# Patient Record
Sex: Male | Born: 1970
Health system: Southern US, Community
[De-identification: ages and names within clinical notes are randomized; demographics above are authoritative.]

## PROBLEM LIST (undated history)

## (undated) DIAGNOSIS — I10 Essential (primary) hypertension: Secondary | ICD-10-CM

## (undated) DIAGNOSIS — E785 Hyperlipidemia, unspecified: Secondary | ICD-10-CM

## (undated) DIAGNOSIS — G4733 Obstructive sleep apnea (adult) (pediatric): Secondary | ICD-10-CM

## (undated) DIAGNOSIS — E119 Type 2 diabetes mellitus without complications: Secondary | ICD-10-CM

## (undated) HISTORY — DX: Essential (primary) hypertension: I10

## (undated) HISTORY — PX: ANKLE SURGERY: SHX546

## (undated) HISTORY — PX: NASAL SINUS SURGERY: SHX719

## (undated) HISTORY — PX: SHOULDER SURGERY: SHX246

## (undated) HISTORY — DX: Hyperlipidemia, unspecified: E78.5

## (undated) HISTORY — DX: Obstructive sleep apnea (adult) (pediatric): G47.33

## (undated) HISTORY — DX: Type 2 diabetes mellitus without complications: E11.9

---

## 2016-12-28 ENCOUNTER — Other Ambulatory Visit: Payer: Self-pay | Admitting: Family Medicine

## 2016-12-28 DIAGNOSIS — R7401 Elevation of levels of liver transaminase levels: Secondary | ICD-10-CM

## 2016-12-28 DIAGNOSIS — R740 Nonspecific elevation of levels of transaminase and lactic acid dehydrogenase [LDH]: Principal | ICD-10-CM

## 2017-01-04 ENCOUNTER — Other Ambulatory Visit: Payer: Self-pay

## 2017-01-25 ENCOUNTER — Other Ambulatory Visit: Payer: Self-pay

## 2017-07-13 ENCOUNTER — Ambulatory Visit (INDEPENDENT_AMBULATORY_CARE_PROVIDER_SITE_OTHER): Payer: Commercial Managed Care - PPO | Admitting: Sports Medicine

## 2017-07-13 ENCOUNTER — Ambulatory Visit (INDEPENDENT_AMBULATORY_CARE_PROVIDER_SITE_OTHER): Payer: Commercial Managed Care - PPO

## 2017-07-13 ENCOUNTER — Encounter: Payer: Self-pay | Admitting: Sports Medicine

## 2017-07-13 ENCOUNTER — Other Ambulatory Visit: Payer: Self-pay | Admitting: Sports Medicine

## 2017-07-13 VITALS — BP 134/84 | HR 103 | Resp 16 | Ht 69.0 in | Wt 250.0 lb

## 2017-07-13 DIAGNOSIS — M7751 Other enthesopathy of right foot: Secondary | ICD-10-CM | POA: Diagnosis not present

## 2017-07-13 DIAGNOSIS — M779 Enthesopathy, unspecified: Secondary | ICD-10-CM

## 2017-07-13 DIAGNOSIS — M79671 Pain in right foot: Secondary | ICD-10-CM | POA: Diagnosis not present

## 2017-07-13 DIAGNOSIS — E119 Type 2 diabetes mellitus without complications: Secondary | ICD-10-CM | POA: Diagnosis not present

## 2017-07-13 MED ORDER — MELOXICAM 15 MG PO TABS: 15.0000 mg | ORAL_TABLET | Freq: Every day | ORAL | 0 refills | Status: AC

## 2017-07-13 MED ORDER — TRIAMCINOLONE ACETONIDE 10 MG/ML IJ SUSP: 10.0000 mg | Freq: Once | INTRAMUSCULAR | Status: AC

## 2017-07-13 NOTE — Progress Notes (Signed)
Subjective: Adam Cohen is a 46 y.o. diabetic male patient who presents to office for evaluation of right foot pain. Patient complains of progressive pain especially over the last month and a half in the right foot at the side of the foot. Patient reports that he is not sure how this pain started could have started after an episode of jogging or after he was mowing the grass states that he has tried new shoes and icing, which has not helped. Patient denies any other pedal complaints. Denies known injury/trip/fall/sprain/any causative factors.   Fasting blood sugars less than 120  There are no active problems to display for this patient.   No current outpatient prescriptions on file prior to visit.   No current facility-administered medications on file prior to visit.     No Known Allergies  Objective:  General: Alert and oriented x3 in no acute distress  Dermatology: No open lesions bilateral lower extremities, no webspace macerations, no ecchymosis bilateral, all nails x 10 are well manicured.  Vascular: Dorsalis Pedis and Posterior Tibial pedal pulses palpable, Capillary Fill Time 3 seconds,(+) pedal hair growth bilateral, no edema bilateral lower extremities, Temperature gradient within normal limits.  Neurology: Michaell CowingGross sensation intact via light touch bilateral, Protective sensation intact  with Phoebe PerchSemmes Weinstein Monofilament to all pedal sites, Position sense intact, vibratory intact bilateral, Deep tendon reflexes within normal limits bilateral, No babinski sign present bilateral. (- )Tinels sign bilateral.   Musculoskeletal: Mild tenderness with palpation at Fifth metatarsal base on right foot, there is limitation in joint range of motion at the first metatarsophalangeal joint with dorsal bone spur on right,No pain with calf compression bilateral. There is decreased ankle rom with knee extending  vs flexed resembling gastroc equnius bilateral, Subtalar joint range of motion is within  normal limits, there is no 1st ray hypermobility noted bilateral, decreased 1st MPJ rom Right>Left with functional limitus noted on weightbearing exam. Strength within normal limits in all groups bilateral.   Gait: Antalgic gait  Xrays  Right Foot   Impression: Normal osseous mineralization, there is decreased joint space at first metatarsal phalangeal joint with surrounding spurs on right, there is significant soft tissue swelling at the fifth metatarsal base. No acute fracture or dislocation. No other acute findings.  Assessment and Plan: Problem List Items Addressed This Visit    None    Visit Diagnoses    Tendonitis    -  Primary   Relevant Medications   triamcinolone acetonide (KENALOG) 10 MG/ML injection 10 mg   meloxicam (MOBIC) 15 MG tablet   Foot pain, right       Relevant Medications   triamcinolone acetonide (KENALOG) 10 MG/ML injection 10 mg   meloxicam (MOBIC) 15 MG tablet   Diabetes mellitus without complication (HCC)       Relevant Medications   metFORMIN (GLUCOPHAGE) 1000 MG tablet   simvastatin (ZOCOR) 20 MG tablet   losartan-hydrochlorothiazide (HYZAAR) 100-25 MG tablet   FARXIGA 10 MG TABS tablet   TRULICITY 1.5 MG/0.5ML SOPN   triamcinolone acetonide (KENALOG) 10 MG/ML injection 10 mg   meloxicam (MOBIC) 15 MG tablet       -Complete examination performed -Xrays reviewed -Discussed treatement options -After oral consent and aseptic prep, injected a mixture containing 1 ml of 2%  plain lidocaine, 1 ml 0.5% plain marcaine, 0.5 ml of kenalog 10 and 0.5 ml of dexamethasone phosphate into right fifth metatarsal base without complication. Post-injection care discussed with patient.  -Dispensed heel cushion -RxMeloxicam to take  as instructed -Recommend good supportive shoes daily for foot type and daily inspection in setting of diabetes -Patient to return to office in 1 month for recheck of injection site or sooner if condition worsens.  Adam Cohen, DPM

## 2017-07-13 NOTE — Progress Notes (Signed)
   Subjective:    Patient ID: Adam FergusonGlenn Cohen, male    DOB: [birth year], 46 y.o.   MRN: [id number]  HPI    Review of Systems  All other systems reviewed and are negative.      Objective:   Physical Exam        Assessment & Plan:

## 2017-08-10 ENCOUNTER — Encounter: Payer: Self-pay | Admitting: Sports Medicine

## 2017-08-10 ENCOUNTER — Ambulatory Visit (INDEPENDENT_AMBULATORY_CARE_PROVIDER_SITE_OTHER): Payer: Commercial Managed Care - PPO | Admitting: Sports Medicine

## 2017-08-10 DIAGNOSIS — E119 Type 2 diabetes mellitus without complications: Secondary | ICD-10-CM | POA: Diagnosis not present

## 2017-08-10 DIAGNOSIS — M779 Enthesopathy, unspecified: Secondary | ICD-10-CM | POA: Diagnosis not present

## 2017-08-10 DIAGNOSIS — M79671 Pain in right foot: Secondary | ICD-10-CM | POA: Diagnosis not present

## 2017-08-10 NOTE — Progress Notes (Signed)
Subjective: Adam Cohen is a 46 y.o. diabetic male patient who presents to office for follow up evaluation of right foot pain. Patient states that the pain is better. Now only a little twinge of pain down to baby toe. Otherwise no other symptoms.   There are no active problems to display for this patient.   Current Outpatient Prescriptions on File Prior to Visit  Medication Sig Dispense Refill  . FARXIGA 10 MG TABS tablet     . fenofibrate 160 MG tablet     . losartan-hydrochlorothiazide (HYZAAR) 100-25 MG tablet     . meloxicam (MOBIC) 15 MG tablet Take 1 tablet (15 mg total) by mouth daily. 30 tablet 0  . metFORMIN (GLUCOPHAGE) 1000 MG tablet     . simvastatin (ZOCOR) 20 MG tablet     . TRULICITY 1.5 MG/0.5ML SOPN      Current Facility-Administered Medications on File Prior to Visit  Medication Dose Route Frequency Provider Last Rate Last Dose  . triamcinolone acetonide (KENALOG) 10 MG/ML injection 10 mg  10 mg Other Once Asencion Islam, DPM        No Known Allergies  Objective:  General: Alert and oriented x3 in no acute distress  Dermatology: No open lesions bilateral lower extremities, no webspace macerations, no ecchymosis bilateral, all nails x 10 are well manicured.  Vascular: Dorsalis Pedis and Posterior Tibial pedal pulses palpable, Capillary Fill Time 3 seconds,(+) pedal hair growth bilateral, no edema bilateral lower extremities, Temperature gradient within normal limits.  Neurology: Michaell Cowing sensation intact via light touch bilateral, Protective sensation intact  with Phoebe Perch Monofilament to all pedal sites, Position sense intact, vibratory intact bilateral, Deep tendon reflexes within normal limits bilateral, No babinski sign present bilateral. (- )Tinels sign bilateral.   Musculoskeletal: Resolved tenderness with palpation at Fifth metatarsal base on right foot, there is limitation in joint range of motion at the first metatarsophalangeal joint with dorsal bone  spur on right,No pain with calf compression bilateral. There is decreased ankle rom with knee extending  vs flexed resembling gastroc equnius bilateral, Subtalar joint range of motion is within normal limits, there is no 1st ray hypermobility noted bilateral, decreased 1st MPJ rom Right>Left with functional limitus noted on weightbearing exam. Strength within normal limits in all groups bilateral.   Assessment and Plan: Problem List Items Addressed This Visit    None    Visit Diagnoses    Tendonitis    -  Primary   Foot pain, right       Diabetes mellitus without complication (HCC)           -Complete examination performed -Discussed long term care for tendonitis and limitus  -Recommend patient to see Raiford Noble for orthotics for peroneal brevis tendonitis and hallux limitis -Continue with Meloxicam until completed  -Recommend good supportive shoes daily for foot type and daily inspection in setting of diabetes -Patient to return to office for orthotics or sooner if condition worsens.  Asencion Islam, DPM

## 2017-08-17 ENCOUNTER — Telehealth: Payer: Self-pay | Admitting: Sports Medicine

## 2017-08-17 ENCOUNTER — Ambulatory Visit: Payer: Commercial Managed Care - PPO | Admitting: Orthotics

## 2017-08-17 DIAGNOSIS — M7671 Peroneal tendinitis, right leg: Secondary | ICD-10-CM

## 2017-08-17 DIAGNOSIS — E119 Type 2 diabetes mellitus without complications: Secondary | ICD-10-CM

## 2017-08-17 DIAGNOSIS — M79671 Pain in right foot: Secondary | ICD-10-CM

## 2017-08-17 NOTE — Telephone Encounter (Signed)
Thanks

## 2017-08-17 NOTE — Telephone Encounter (Signed)
Spoke to pt made him aware insurance coverage is 90% after deductible and as long as it is ordered to prevent injury or help recover from injury. Per Dr Marylene LandStover it is to prevent injury and she is documenting additional info into chart. Pt would be responsible for 10% and is aware and said to proceed with the orthotics.

## 2017-08-17 NOTE — Progress Notes (Signed)
Patient discussed with Raiford Nobleick, orthotist. Patient requires biomechanical control to prevent overuse injury to previously inflamed peroneal tendon on right. -Dr. Marylene LandStover

## 2017-08-31 ENCOUNTER — Telehealth: Payer: Self-pay | Admitting: *Deleted

## 2017-08-31 NOTE — Telephone Encounter (Signed)
Received refill request for Meloxicam. Dr. Marylene Land states pt needs an appt prior to future refills. Return fax denying.

## 2017-09-06 ENCOUNTER — Encounter: Payer: Commercial Managed Care - PPO | Admitting: Orthotics

## 2017-09-13 ENCOUNTER — Ambulatory Visit (INDEPENDENT_AMBULATORY_CARE_PROVIDER_SITE_OTHER): Payer: Commercial Managed Care - PPO | Admitting: Orthotics

## 2017-09-13 DIAGNOSIS — E119 Type 2 diabetes mellitus without complications: Secondary | ICD-10-CM

## 2017-09-13 DIAGNOSIS — M779 Enthesopathy, unspecified: Secondary | ICD-10-CM

## 2017-09-13 DIAGNOSIS — M7671 Peroneal tendinitis, right leg: Secondary | ICD-10-CM

## 2017-09-13 NOTE — Progress Notes (Signed)
Patient came in today to pick up custom made foot orthotics.  The goals were accomplished and the patient reported no dissatisfaction with said orthotics.  Patient was advised of breakin period and how to report any issues. 

## 2018-05-03 ENCOUNTER — Ambulatory Visit: Payer: Commercial Managed Care - PPO | Admitting: Sports Medicine

## 2018-05-03 ENCOUNTER — Telehealth: Payer: Self-pay | Admitting: *Deleted

## 2018-05-03 ENCOUNTER — Encounter: Payer: Self-pay | Admitting: Sports Medicine

## 2018-05-03 VITALS — BP 146/89 | HR 83

## 2018-05-03 DIAGNOSIS — M79671 Pain in right foot: Secondary | ICD-10-CM | POA: Diagnosis not present

## 2018-05-03 DIAGNOSIS — E119 Type 2 diabetes mellitus without complications: Secondary | ICD-10-CM

## 2018-05-03 DIAGNOSIS — M7671 Peroneal tendinitis, right leg: Secondary | ICD-10-CM

## 2018-05-03 DIAGNOSIS — M2021 Hallux rigidus, right foot: Secondary | ICD-10-CM | POA: Diagnosis not present

## 2018-05-03 NOTE — Telephone Encounter (Signed)
-----   Message from [place], [place] sent at 05/03/2018  9:02 AM EDT ----- Regarding: MRI R Foot  Pain at lateral side of foot and 1st MTPJ and arch r/o peroneal partial tear eval 1st MTPJ for worsensing arthritis and plantar fascia  -Dr. Kathie Rhodes

## 2018-05-03 NOTE — Telephone Encounter (Signed)
Orders to [facility] for pre-cert, and faxed to [facility]. 

## 2018-05-03 NOTE — Progress Notes (Signed)
Subjective: Adam Cohen is a 47 y.o. diabetic male patient who presents to office for follow up evaluation of right foot pain. Patient states that the pain is back at side of foot and also at big toe joint especially that was aggrevaited with insoles and caused burning pain and arch pain so bad he had to take out liner in shoe in order to walk. Reports from past injection only got 2 weeks of relief. So far pain has continued nothing has offered long term relief. No other issues  FBS per patient is good. Does not recall last A1c.  There are no active problems to display for this patient.   Current Outpatient Medications on File Prior to Visit  Medication Sig Dispense Refill  . cefUROXime (CEFTIN) 250 MG tablet Take 250 mg by mouth 2 (two) times daily.  0  . cyclobenzaprine (FLEXERIL) 5 MG tablet TAKE 1 TO 2 TABLETS BY MOUTH 3 TIMES A DAY AS NEEDED SPASM  1  . FARXIGA 10 MG TABS tablet     . fenofibrate 160 MG tablet     . fluticasone (FLONASE) 50 MCG/ACT nasal spray 1-2 SPRAYS INTO EACH NOSTRIL TWICE A DAY  11  . losartan-hydrochlorothiazide (HYZAAR) 100-25 MG tablet     . meloxicam (MOBIC) 15 MG tablet Take 1 tablet (15 mg total) by mouth daily. 30 tablet 0  . metFORMIN (GLUCOPHAGE) 1000 MG tablet     . simvastatin (ZOCOR) 20 MG tablet     . TRULICITY 1.5 MG/0.5ML SOPN      Current Facility-Administered Medications on File Prior to Visit  Medication Dose Route Frequency Provider Last Rate Last Dose  . triamcinolone acetonide (KENALOG) 10 MG/ML injection 10 mg  10 mg Other Once Asencion Islam, DPM        No Known Allergies  Objective:  General: Alert and oriented x3 in no acute distress  Dermatology: No open lesions bilateral lower extremities, no webspace macerations, no ecchymosis bilateral, all nails x 10 are well manicured.  Vascular: Dorsalis Pedis and Posterior Tibial pedal pulses palpable, Capillary Fill Time 3 seconds,(+) pedal hair growth bilateral, no edema bilateral lower  extremities, Temperature gradient within normal limits.  Neurology: Gross sensation intact via light touch bilateral, Protective sensation intact  with Semmes Weinstein Monofilament to all pedal sites, Position sense intact, vibratory intact bilateral, Deep tendon reflexes within normal limits bilateral, No babinski sign present bilateral. (- )Tinels sign bilateral. Subjective burning in forefoot on right.   Musculoskeletal: Mild tenderness with palpation at Fifth metatarsal base on right foot and arch on right, there is limitation in joint range of motion at the first metatarsophalangeal joint with dorsal bone spur on right,No pain with calf compression bilateral. There is decreased ankle rom with knee extending  vs flexed resembling gastroc equnius bilateral, Subtalar joint range of motion is within normal limits, there is no 1st ray hypermobility noted bilateral, decreased 1st MPJ rom Right>Left with functional limitus noted on weightbearing exam. Strength within normal limits in all groups bilateral.   Assessment and Plan: Problem List Items Addressed This Visit    None    Visit Diagnoses    Peroneal tendonitis of right lower leg    -  Primary   Hallux rigidus of right foot       Diabetes mellitus without complication (HCC)       Arch pain of right foot           -Complete examination performed -Discussed long term care for tendonitis  and limitus with arch pain in setting of diabetes  -Rx MRI eval peroneal tendons r/o partial tear, 1st arthritis and fasciits -Advised patient to continue with supportive care insoles, rest, ice, elevation, soaking topical pain rubs nad OTC anti-inflammatories -Patient to return to office after MRI or sooner if condition worsens.Advised patient will likely need surgery depending on the results of his MRI.   Asencion Islam, DPM

## 2018-05-11 ENCOUNTER — Telehealth: Payer: Self-pay | Admitting: *Deleted

## 2018-05-11 NOTE — Telephone Encounter (Signed)
"  I have an MRI scheduled for tomorrow.  I guess you guys were supposed to contact my insurance or do whatever and find out if I need a pre-authorization or not.  I have not heard back about anything.  I'm curious if that is a necessity or you know what the insurance said.  I am trying to find out some information about it.  Call me back and let me know."

## 2018-05-12 ENCOUNTER — Ambulatory Visit
Admission: RE | Admit: 2018-05-12 | Discharge: 2018-05-12 | Disposition: A | Discharge status: Home / Self Care | Payer: Commercial Managed Care - PPO | Source: Ambulatory Visit | Attending: Sports Medicine | Admitting: Sports Medicine

## 2018-05-12 DIAGNOSIS — M79671 Pain in right foot: Secondary | ICD-10-CM

## 2018-05-12 DIAGNOSIS — M2021 Hallux rigidus, right foot: Secondary | ICD-10-CM

## 2018-05-12 DIAGNOSIS — M7671 Peroneal tendinitis, right leg: Secondary | ICD-10-CM

## 2018-05-12 NOTE — Telephone Encounter (Signed)
I left him a message that his MRI was authorized by a staff person at [facility].  I asked him to call if he had further questions.

## 2018-05-24 ENCOUNTER — Encounter: Payer: Self-pay | Admitting: Sports Medicine

## 2018-05-24 ENCOUNTER — Ambulatory Visit: Payer: Commercial Managed Care - PPO | Admitting: Sports Medicine

## 2018-05-24 DIAGNOSIS — E119 Type 2 diabetes mellitus without complications: Secondary | ICD-10-CM

## 2018-05-24 DIAGNOSIS — M79671 Pain in right foot: Secondary | ICD-10-CM

## 2018-05-24 DIAGNOSIS — M7671 Peroneal tendinitis, right leg: Secondary | ICD-10-CM

## 2018-05-24 DIAGNOSIS — M2021 Hallux rigidus, right foot: Secondary | ICD-10-CM | POA: Diagnosis not present

## 2018-05-24 MED ORDER — TRIAMCINOLONE ACETONIDE 10 MG/ML IJ SUSP: 10.0000 mg | Freq: Once | INTRAMUSCULAR | Status: AC

## 2018-05-24 NOTE — Progress Notes (Signed)
Subjective: Adam Cohen is a 47 y.o. diabetic male patient who presents to office for follow up evaluation of right foot pain. Reports pain to lateral side of foot. Patient states that the pain is still present at Right side of foot and big toe joint. No other issues  FBS per patient is good. Does not recall last A1c.  There are no active problems to display for this patient.   Current Outpatient Medications on File Prior to Visit  Medication Sig Dispense Refill  . cefUROXime (CEFTIN) 250 MG tablet Take 250 mg by mouth 2 (two) times daily.  0  . cyclobenzaprine (FLEXERIL) 5 MG tablet TAKE 1 TO 2 TABLETS BY MOUTH 3 TIMES A DAY AS NEEDED SPASM  1  . FARXIGA 10 MG TABS tablet     . fenofibrate 160 MG tablet     . fluticasone (FLONASE) 50 MCG/ACT nasal spray 1-2 SPRAYS INTO EACH NOSTRIL TWICE A DAY  11  . losartan-hydrochlorothiazide (HYZAAR) 100-25 MG tablet     . meloxicam (MOBIC) 15 MG tablet Take 1 tablet (15 mg total) by mouth daily. 30 tablet 0  . metFORMIN (GLUCOPHAGE) 1000 MG tablet     . simvastatin (ZOCOR) 20 MG tablet     . TRULICITY 1.5 MG/0.5ML SOPN      Current Facility-Administered Medications on File Prior to Visit  Medication Dose Route Frequency Provider Last Rate Last Dose  . triamcinolone acetonide (KENALOG) 10 MG/ML injection 10 mg  10 mg Other Once Asencion IslamStover, Jovanna Hodges, DPM        No Known Allergies  Objective:  General: Alert and oriented x3 in no acute distress  Dermatology: No open lesions bilateral lower extremities, no webspace macerations, no ecchymosis bilateral, all nails x 10 are well manicured.  Vascular: Dorsalis Pedis and Posterior Tibial pedal pulses palpable, Capillary Fill Time 3 seconds,(+) pedal hair growth bilateral, no edema bilateral lower extremities, Temperature gradient within normal limits.  Neurology: Gross sensation intact via light touch bilateral, Protective sensation intact  with Semmes Weinstein Monofilament to all pedal sites, Position  sense intact, vibratory intact bilateral, Deep tendon reflexes within normal limits bilateral, No babinski sign present bilateral. (- )Tinels sign bilateral. Subjective burning in forefoot on right.   Musculoskeletal: Mild tenderness with palpation at Fifth  MTP and metatarsal base on right foot and arch on right, there is limitation in joint range of motion at the first metatarsophalangeal joint with dorsal bone spur on right,No pain with calf compression bilateral. There is decreased ankle rom with knee extending  vs flexed resembling gastroc equnius bilateral, Subtalar joint range of motion is within normal limits, there is no 1st ray hypermobility noted bilateral, decreased 1st MPJ rom Right>Left with functional limitus noted on weightbearing exam. Strength within normal limits in all groups bilateral.   MRI IMPRESSION: RIGHT  1. Severe osteoarthritis of the first MTP joint as described. 2. Small nonspecific subtalar joint effusion. 3. The tendons of the ankle appear normal including the peroneal tendons.  Assessment and Plan: Problem List Items Addressed This Visit    None    Visit Diagnoses    Hallux rigidus of right foot    -  Primary   Peroneal tendonitis of right lower leg       Arch pain of right foot       Diabetes mellitus without complication (HCC)           -Complete examination performed -Discussed long term care for tendonitis and limitus with arch pain  in setting of diabetes  -MRI results reviewed  -After oral consent and aseptic prep, injected a mixture containing 1 ml of 2%  plain lidocaine, 1 ml 0.5% plain marcaine, 0.5 ml of kenalog 10 and 0.5 ml of dexamethasone phosphate into R 5th ray without complication. Post-injection care discussed with patient.  -Added felt padding to right insole  -Follow up for surgery consult in August.   Asencion Islam, DPM

## 2018-06-06 ENCOUNTER — Telehealth: Payer: Self-pay | Admitting: Sports Medicine

## 2018-06-06 NOTE — Telephone Encounter (Signed)
I'm calling to find out how do I get a copy of the MRI I had done on a CD to pick up to take to review with another physician. My phone number is [phone]. Thanks. Bye.

## 2018-06-06 NOTE — Telephone Encounter (Signed)
Called pt to let him know if he wants the actual MRI images on a disc he would need to contact where he went for his MRI because I can only give him the report. Pt stated he understood.

## 2018-08-02 ENCOUNTER — Ambulatory Visit: Payer: Commercial Managed Care - PPO | Admitting: Sports Medicine

## 2018-12-23 DIAGNOSIS — K219 Gastro-esophageal reflux disease without esophagitis: Secondary | ICD-10-CM | POA: Diagnosis not present

## 2018-12-23 DIAGNOSIS — J324 Chronic pansinusitis: Secondary | ICD-10-CM | POA: Diagnosis not present

## 2018-12-23 DIAGNOSIS — R05 Cough: Secondary | ICD-10-CM | POA: Diagnosis not present

## 2019-06-13 NOTE — Progress Notes (Signed)
Referring-Dibas Koirala MD Reason for referral-chest pain  HPI: 48 year old male for evaluation of chest pain at request of Dibas Koirala MD. patient has had occasional chest pain since he was a teenager by report.  It is in the left breast area and is a stabbing sensation lasting 2 minutes at a time.  It is not pleuritic, positional or exertional.  Resolves spontaneously.  No associated symptoms.  No radiation.  He has mild dyspnea with more vigorous activities but not routine activities.  No orthopnea, PND or pedal edema.  No history of syncope.  Cardiology now asked to evaluate.  Current Outpatient Medications  Medication Sig Dispense Refill  . aspirin EC 81 MG tablet Take 81 mg by mouth daily.    Marland Kitchen. FARXIGA 10 MG TABS tablet     . fenofibrate 160 MG tablet     . fluticasone (FLONASE) 50 MCG/ACT nasal spray 1-2 SPRAYS INTO EACH NOSTRIL TWICE A DAY  11  . HYDROcodone-acetaminophen (NORCO/VICODIN) 5-325 MG tablet Take 1-2 tablets by mouth as directed.     Marland Kitchen. losartan-hydrochlorothiazide (HYZAAR) 100-25 MG tablet     . meloxicam (MOBIC) 15 MG tablet Take 1 tablet (15 mg total) by mouth daily. 30 tablet 0  . metFORMIN (GLUCOPHAGE) 1000 MG tablet     . simvastatin (ZOCOR) 20 MG tablet     . TRULICITY 1.5 MG/0.5ML SOPN      Current Facility-Administered Medications  Medication Dose Route Frequency Provider Last Rate Last Dose  . triamcinolone acetonide (KENALOG) 10 MG/ML injection 10 mg  10 mg Other Once Asencion IslamStover, [place], DPM      . triamcinolone acetonide (KENALOG) 10 MG/ML injection 10 mg  10 mg Other Once Asencion IslamStover, Titorya, DPM        No Known Allergies   Past Medical History:  Diagnosis Date  . Diabetes mellitus (HCC)   . Hyperlipidemia   . Hypertension   . OSA (obstructive sleep apnea)     Past Surgical History:  Procedure Laterality Date  . ANKLE SURGERY    . NASAL SINUS SURGERY    . SHOULDER SURGERY      Social History   Socioeconomic History  . Marital status:  Married    Spouse name: Not on file  . Number of children: 3  . Years of education: Not on file  . Highest education level: Not on file  Occupational History    Comment: Human resources officerinancial manager  Social Needs  . Financial resource strain: Not on file  . Food insecurity    Worry: Not on file    Inability: Not on file  . Transportation needs    Medical: Not on file    Non-medical: Not on file  Tobacco Use  . Smoking status: Never Smoker  . Smokeless tobacco: Never Used  Substance and Sexual Activity  . Alcohol use: Yes    Comment: occasional  . Drug use: Never  . Sexual activity: Not on file  Lifestyle  . Physical activity    Days per week: Not on file    Minutes per session: Not on file  . Stress: Not on file  Relationships  . Social Musicianconnections    Talks on phone: Not on file    Gets together: Not on file    Attends religious service: Not on file    Active member of club or organization: Not on file    Attends meetings of clubs or organizations: Not on file    Relationship status: Not on  file  . Intimate partner violence    Fear of current or ex partner: Not on file    Emotionally abused: Not on file    Physically abused: Not on file    Forced sexual activity: Not on file  Other Topics Concern  . Not on file  Social History Narrative  . Not on file    Family History  Problem Relation Age of Onset  . Hypertension Mother   . CAD Mother   . CAD Father     ROS: no fevers or chills, productive cough, hemoptysis, dysphasia, odynophagia, melena, hematochezia, dysuria, hematuria, rash, seizure activity, orthopnea, PND, pedal edema, claudication. Remaining systems are negative.  Physical Exam:   Blood pressure 121/88, height 5\' 9"  (1.753 m), weight 260 lb (117.9 kg).  General:  Well developed/well nourished in NAD Skin warm/dry Patient not depressed No peripheral clubbing Back-normal HEENT-normal/normal eyelids Neck supple/normal carotid upstroke bilaterally; no  bruits; no JVD; no thyromegaly chest - CTA/ normal expansion CV - RRR/normal S1 and S2; no murmurs, rubs or gallops;  PMI nondisplaced Abdomen -NT/ND, no HSM, no mass, + bowel sounds, no bruit 2+ femoral pulses, no bruits Ext-no edema, chords, 2+ DP Neuro-grossly nonfocal  ECG --sinus rhythm with no ST changes.  Personally reviewed  A/P  1 chest pain-symptoms are atypical.  We will arrange a stress nuclear study for risk stratification.  Multiple risk factors including diabetes, hypertension, hyperlipidemia and family history.  2 dyspnea on exertion-we will arrange an echocardiogram to assess LV function.  3 hypertension-patient's blood pressure is controlled.  Continue present medications and follow.  4 hyperlipidemia-continue statin.  Managed by primary care.  5 obesity-we discussed the importance of diet and exercise.  Kirk Ruths, MD

## 2019-06-14 ENCOUNTER — Telehealth (INDEPENDENT_AMBULATORY_CARE_PROVIDER_SITE_OTHER): Payer: Commercial Managed Care - PPO | Admitting: Cardiology

## 2019-06-14 ENCOUNTER — Encounter: Payer: Self-pay | Admitting: Cardiology

## 2019-06-14 ENCOUNTER — Other Ambulatory Visit: Payer: Self-pay

## 2019-06-14 ENCOUNTER — Encounter: Payer: Self-pay | Admitting: *Deleted

## 2019-06-14 VITALS — BP 121/88 | Ht 69.0 in | Wt 260.0 lb

## 2019-06-14 DIAGNOSIS — I10 Essential (primary) hypertension: Secondary | ICD-10-CM

## 2019-06-14 DIAGNOSIS — E7800 Pure hypercholesterolemia, unspecified: Secondary | ICD-10-CM

## 2019-06-14 DIAGNOSIS — R0600 Dyspnea, unspecified: Secondary | ICD-10-CM

## 2019-06-14 DIAGNOSIS — R072 Precordial pain: Secondary | ICD-10-CM

## 2019-06-14 NOTE — Patient Instructions (Signed)
Medication Instructions:  NO CHANGE If you need a refill on your cardiac medications before your next appointment, please call your pharmacy.   Lab work: If you have labs (blood work) drawn today and your tests are completely normal, you will receive your results only by: Marland Kitchen MyChart Message (if you have MyChart) OR . A paper copy in the mail If you have any lab test that is abnormal or we need to change your treatment, we will call you to review the results.  Testing/Procedures: Your physician has requested that you have a lexiscan myoview. For further information please visit [email or web address]. Please follow instruction sheet, as given.  [facility] has requested that you have an echocardiogram. Echocardiography is a painless test that uses sound waves to create images of your heart. It provides your doctor with information about the size and shape of your heart and how well your heart's chambers and valves are working. This procedure takes approximately one hour. There are no restrictions for this procedure.  [place]  Follow-Up: At [facility], you and your health needs are our priority.  As part of our continuing mission to provide you with exceptional heart care, we have created designated Provider Care Teams.  These Care Teams include your primary Cardiologist (physician) and Advanced Practice Providers (APPs -  Physician Assistants and Nurse Practitioners) who all work together to provide you with the care you need, when you need it. Your physician recommends that you schedule a follow-up appointment in:  AS NEEDED PENDING TEST RESULTS

## 2019-06-27 ENCOUNTER — Telehealth (HOSPITAL_COMMUNITY): Payer: Self-pay

## 2019-06-27 NOTE — Telephone Encounter (Signed)
Instructions left on the patient's answering machine, asked to call back with any questions. S.Jettie Lazare EMTP 

## 2019-06-30 ENCOUNTER — Ambulatory Visit (HOSPITAL_BASED_OUTPATIENT_CLINIC_OR_DEPARTMENT_OTHER): Payer: Commercial Managed Care - PPO

## 2019-06-30 ENCOUNTER — Ambulatory Visit (HOSPITAL_COMMUNITY): Payer: Commercial Managed Care - PPO | Attending: Cardiology

## 2019-06-30 ENCOUNTER — Other Ambulatory Visit: Payer: Self-pay

## 2019-06-30 DIAGNOSIS — R072 Precordial pain: Secondary | ICD-10-CM

## 2019-06-30 MED ORDER — TECHNETIUM TC 99M TETROFOSMIN IV KIT
31.4000 | PACK | Freq: Once | INTRAVENOUS | Status: AC | PRN
  Administered 2019-06-30: 31.4 via INTRAVENOUS
  Filled 2019-06-30: qty 32

## 2019-06-30 MED ORDER — TECHNETIUM TC 99M TETROFOSMIN IV KIT
9.7000 | PACK | Freq: Once | INTRAVENOUS | Status: AC | PRN
  Administered 2019-06-30: 9.7 via INTRAVENOUS
  Filled 2019-06-30: qty 10

## 2019-06-30 MED ORDER — REGADENOSON 0.4 MG/5ML IV SOLN
0.4000 mg | Freq: Once | INTRAVENOUS | Status: AC
  Administered 2019-06-30: 0.4 mg via INTRAVENOUS

## 2019-07-03 LAB — MYOCARDIAL PERFUSION IMAGING
LV dias vol: 93 mL (ref 62–150)
LV sys vol: 31 mL
Peak HR: 122 {beats}/min
Rest HR: 77 {beats}/min
SDS: 0
SRS: 0
SSS: 0
TID: 1.12

## 2019-11-16 IMAGING — MR MR FOOT*R* W/O CM
4 of 5 series · 17 of 40 positions shown · non-contrast
Comparison: Radiographs dated 07/13/2017

CLINICAL DATA: Right foot pain. Peroneal tendinitis of the right
ankle.

EXAM:
MRI OF THE RIGHT FOOT WITHOUT CONTRAST
TECHNIQUE: Multiplanar, multisequence MR imaging of the right foot was
performed. No intravenous contrast was administered.

[Series 4: T2 fat-sat · sagittal · right · 3.0mm · 0.62mm/px · 5 of 32 slices shown (1 of 3)]
[im 1/32]
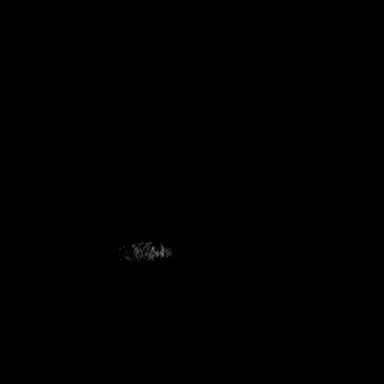
[im 7/32]
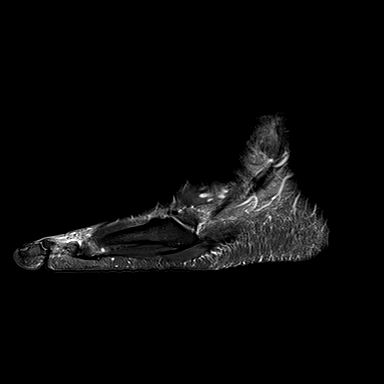
[im 13/32]
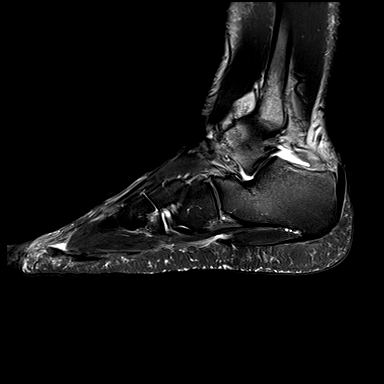
[im 19/32]
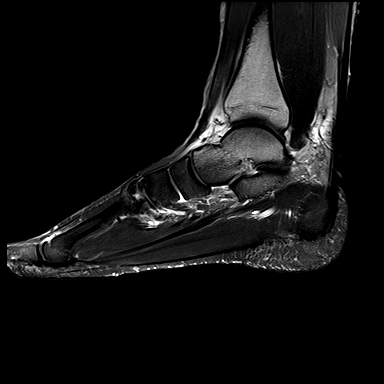
[im 32/32]
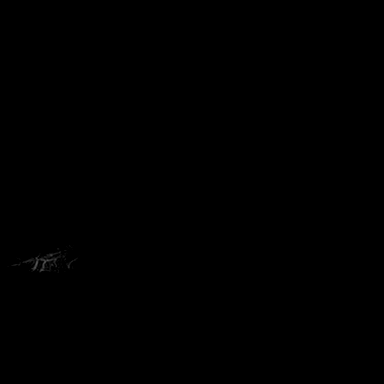

[Series 5: T2 fat-sat · coronal · right · 4.0mm · 0.36mm/px · 3 of 44 slices shown (2 of 3)]
[im 5/44]
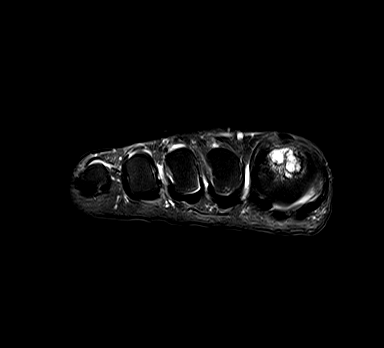
[im 24/44]
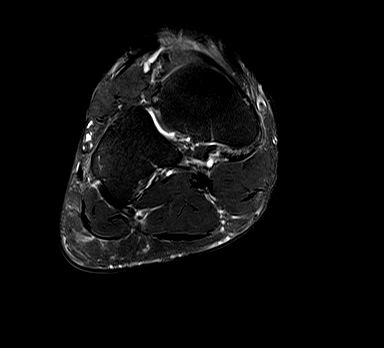
[im 39/44]
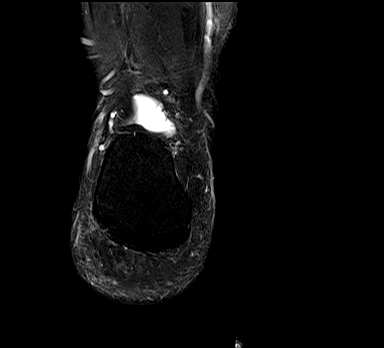

[Series 7: T2 fat-sat · axial · right · 3.0mm · 0.54mm/px · z∈[-90,-14]mm · 3 of 36 slices shown (3 of 3)]
[im 6/36]
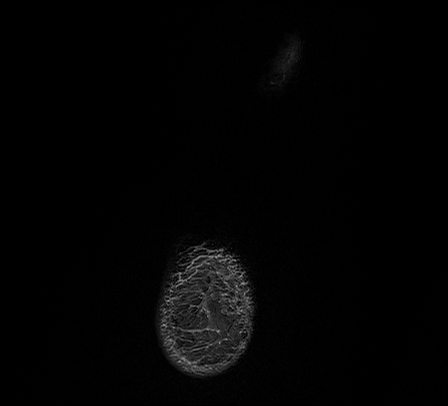
[im 21/36]
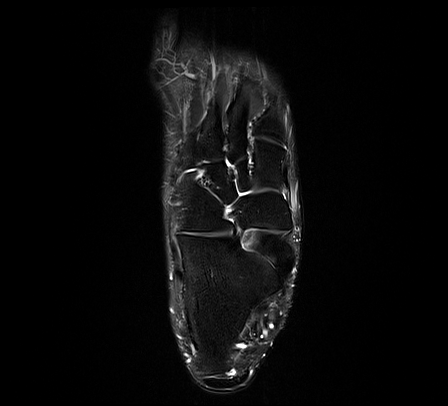
[im 31/36]
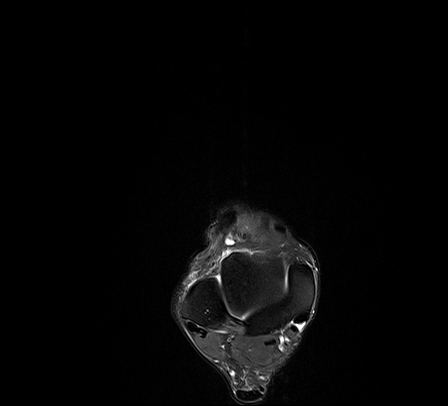

[Series 8: PD fat-sat · axial · right · 4.0mm · 0.20mm/px · z∈[-43,+77]mm · 6 of 27 slices shown]
[im 1/27]
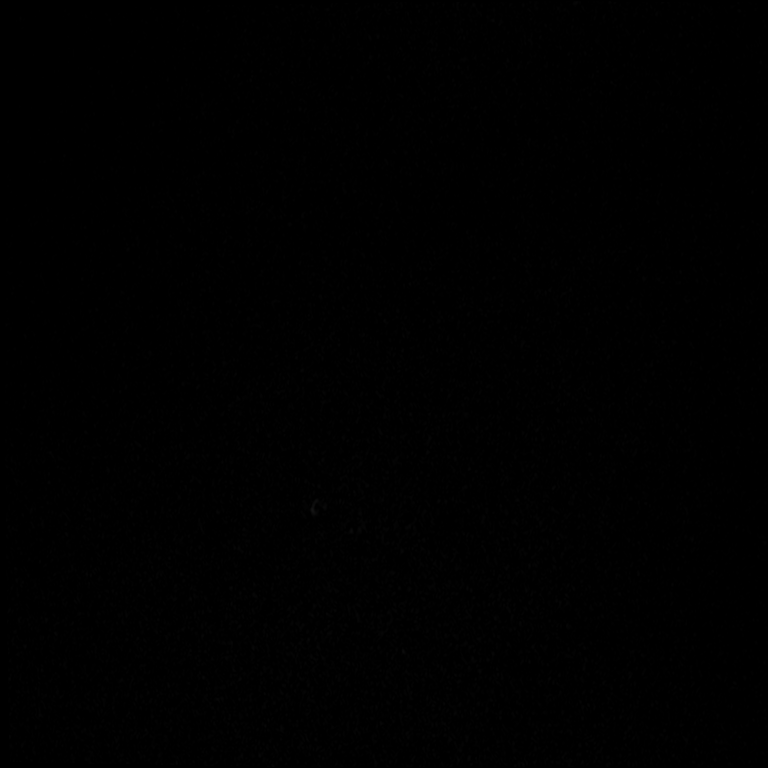
[im 6/27]
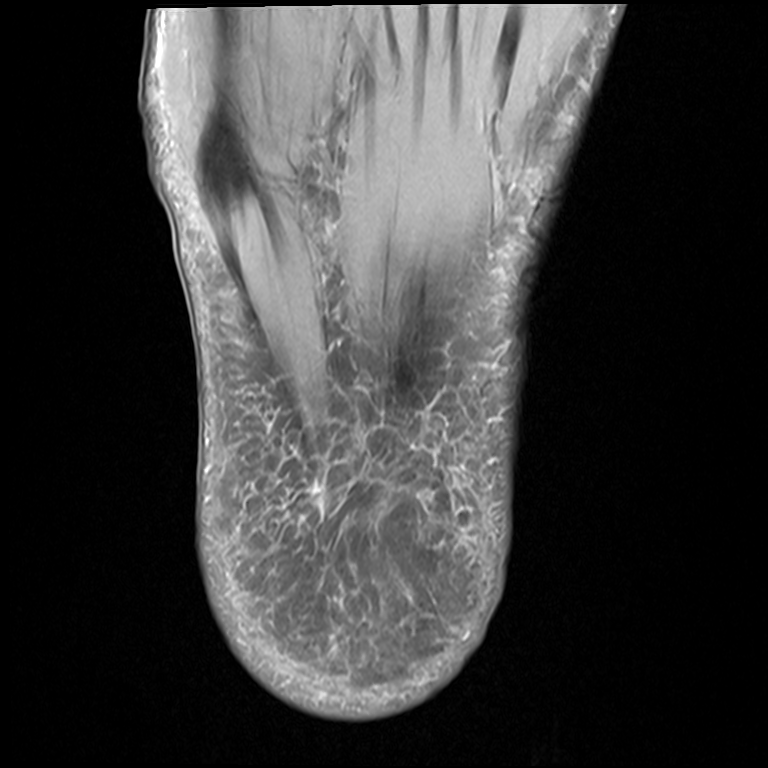
[im 11/27]
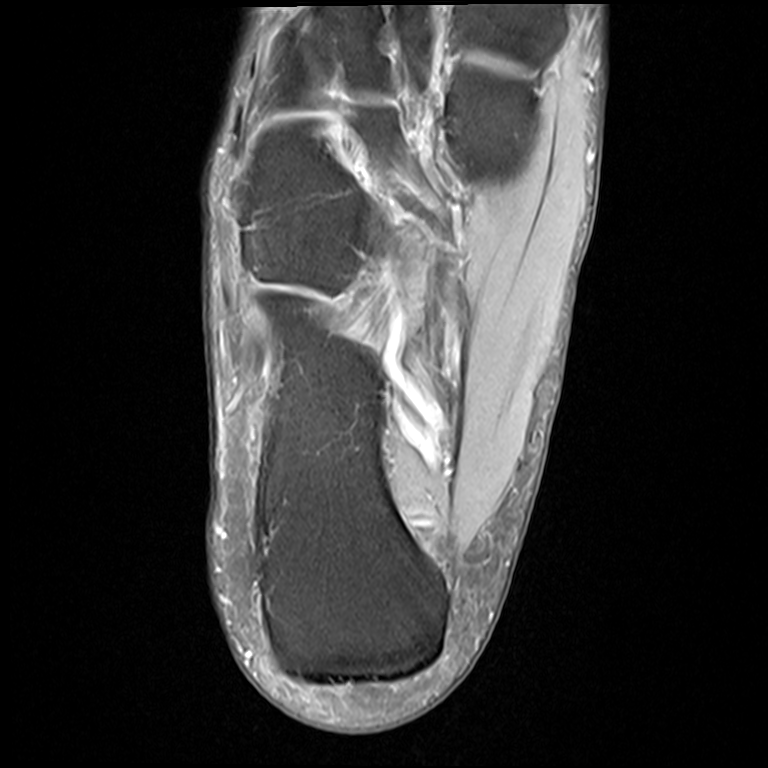
[im 16/27]
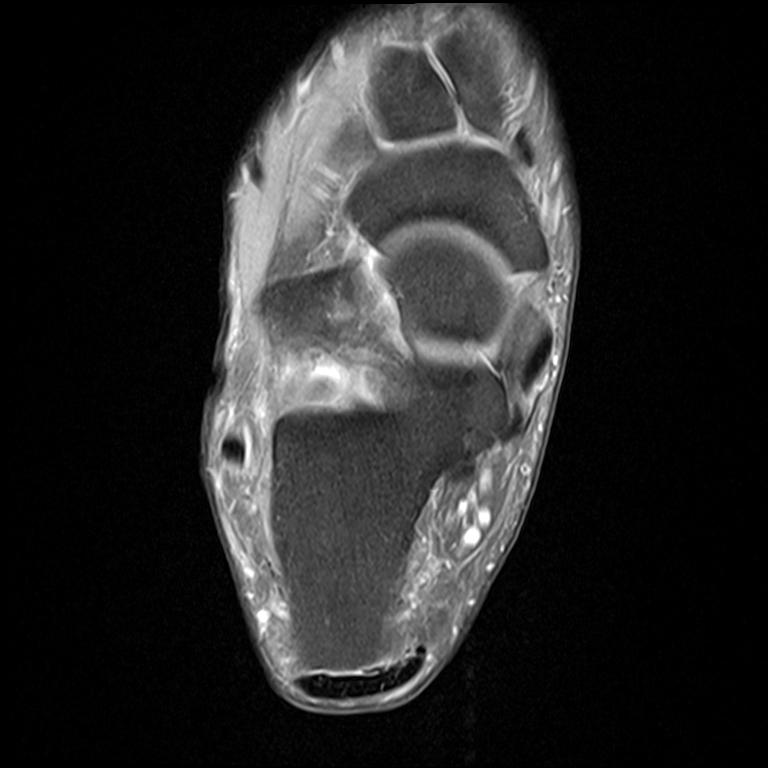
[im 21/27]
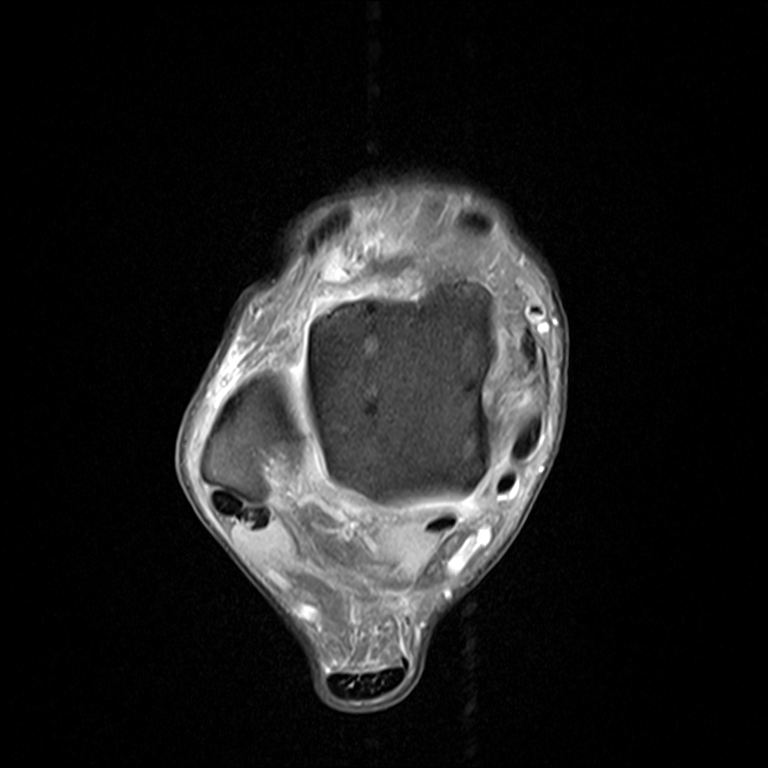
[im 27/27]
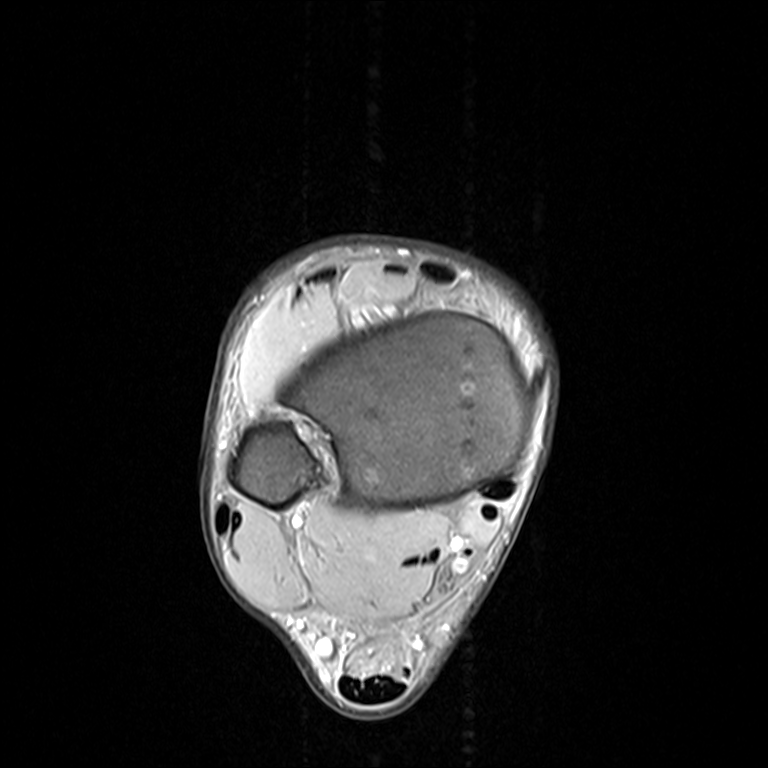

[17 of 40 positions shown; findings below may reference images not displayed]

FINDINGS: TENDONS

Peroneal: Normal. Specifically, no evidence of tear or degeneration
or tenosynovitis of the peroneal tendons. Specifically, the peroneus
brevis tendon appears normal including at its insertion on the base
of the fifth metatarsal.

Posteromedial: Normal.

Anterior: Normal.

Achilles: Normal.

Plantar Fascia: Normal.

LIGAMENTS

Lateral: Normal.

Medial: Normal.

CARTILAGE

Ankle Joint: Normal.

Subtalar Joints/Sinus Tarsi: Small nonspecific effusion in the
posterior aspect of the subtalar joint. No chondral defects. Spring
ligament is intact.

Bones: Severe arthritic changes of first MTP joint. Complex
prominent 14 x 12 x 12 mm subcortical cyst in the head of the fifth
metatarsal. Marginal osteophytes. Focal edema in the dorsal aspect
of the base of the proximal phalanx of the great toe. Sesamoids
appear normal.

Other: None
IMPRESSION: 1. Severe osteoarthritis of the first MTP joint as described.
2. Small nonspecific subtalar joint effusion.
3. The tendons of the ankle appear normal including the peroneal
tendons.

## 2023-01-08 ENCOUNTER — Other Ambulatory Visit (HOSPITAL_BASED_OUTPATIENT_CLINIC_OR_DEPARTMENT_OTHER): Payer: Self-pay | Admitting: Family Medicine

## 2023-01-08 DIAGNOSIS — E7800 Pure hypercholesterolemia, unspecified: Secondary | ICD-10-CM

## 2023-01-29 ENCOUNTER — Ambulatory Visit (HOSPITAL_BASED_OUTPATIENT_CLINIC_OR_DEPARTMENT_OTHER)
Admission: RE | Admit: 2023-01-29 | Discharge: 2023-01-29 | Disposition: A | Discharge status: Home / Self Care | Payer: Commercial Managed Care - PPO | Source: Ambulatory Visit | Attending: Family Medicine | Admitting: Family Medicine

## 2023-01-29 DIAGNOSIS — E7800 Pure hypercholesterolemia, unspecified: Secondary | ICD-10-CM | POA: Insufficient documentation

## 2023-02-24 NOTE — Progress Notes (Signed)
Cardiology Office Note:   Date:  03/08/2023  ID:  Adam Cohen, DOB 1971-11-30, MRN [id number]  History of Present Illness:   Adam Cohen is a 52 y.o. male with history of DMII, HTN, HLD and coronary artery calcification on CT chest who was referred by Dr. Dorthy Cooler for further evaluation of coronary artery disease.  Patient seen by Dr. Stanford Breed in 2020 for chest pain. Myoview at that time normal with no ischemia. TTE 06/2019 with LVEF >65%, normal RV, no significant valve disease.  Underwent Ca score in 01/2023 which was 250 (95%) prompting referral to Cardiology for further evaluation.  Today, the patient overall feels well. He has chronic chest pain that has been present since high school but this has not changed or progressed. Has been noting increased dyspnea on exertion with activity when compared to several months ago. Finding he is needing to take more breaks than he used to in order to catch his breath. No exertional chest pain, orthopnea, PND, LE edema. Does not monitor blood pressure at home, but running 140s in clinic.  Mother with CAD s/p CABG; Father with CAD s/p CABG (50s)  ROS: As per HPI  Studies Reviewed:    EKG:  NSR, TWI inferior leads, HR 81-personally reviewed  Cardiac Studies & Procedures     STRESS TESTS  MYOCARDIAL PERFUSION IMAGING 07/03/2019  Narrative  Nuclear stress EF: 67%. No wall motion abnormalities.  The left ventricular ejection fraction is hyperdynamic (>65%).  There was no ST segment deviation noted during stress.  This is a low risk study. no ischemia.  Candee Furbish, MD   ECHOCARDIOGRAM  ECHOCARDIOGRAM COMPLETE 06/30/2019  Narrative ECHOCARDIOGRAM REPORT    Patient Name:   Adam Cohen    Date of Exam: 06/30/2019 Medical Rec #:  [id number]     Height:       69.0 in Accession #:    [id number]    Weight:       260.0 lb Date of Birth:  Apr 19, 1971     BSA:          2.31 m Patient Age:    52 years      BP:           122/88 mmHg Patient Gender:  M             HR:           83 bpm. Exam Location:  [place]   Procedure: 2D Echo, Cardiac Doppler, Color Doppler and Strain Analysis  Indications:    R07.9 Chest pain  History:        Patient has no prior history of Echocardiogram examinations. Signs/Symptoms: DOE and Chest Pain Risk Factors: Obesity, Sleep Apnea, Family History of Coronary Artery Disease, Diabetes, Hypertension and Dyslipidemia.  Sonographer:    Lenard Galloway BA, RDCS Referring Phys: [place]   1. The left ventricle has hyperdynamic systolic function, with an ejection fraction of >65%. The cavity size was normal. Left ventricular diastolic Doppler parameters are consistent with impaired relaxation. Indeterminate filling pressures The E/e' is 8-15. 2. The right ventricle has normal systolic function. The cavity was normal. There is no increase in right ventricular wall thickness. 3. The mitral valve is grossly normal. 4. The tricuspid valve is grossly normal. 5. The aortic valve is tricuspid. No stenosis of the aortic valve. 6. The aortic root and ascending aorta are normal in size and structure. 7. The average left ventricular global longitudinal strain is -21.0 %.  SUMMARY  LVEF >65%, normal wall thickness, normal wall motion, grade 1 DD, indeterminate LV filling pressure, normal GLS at -21%, normal biatrial size, no significant valvular disease, normal aorta, no pericardial effusion FINDINGS Left Ventricle: The left ventricle has hyperdynamic systolic function, with an ejection fraction of >65%. The cavity size was normal. There is no increase in left ventricular wall thickness. Left ventricular diastolic Doppler parameters are consistent with impaired relaxation. Indeterminate filling pressures The E/e' is 8-15. The average left ventricular global longitudinal strain is -21.0 %.  Right Ventricle: The right ventricle has normal systolic function. The cavity was normal. There  is no increase in right ventricular wall thickness.  Left Atrium: Left atrial size was normal in size.  Right Atrium: Right atrial size was normal in size. Right atrial pressure is estimated at 10 mmHg.  Interatrial Septum: No atrial level shunt detected by color flow Doppler.  Pericardium: There is no evidence of pericardial effusion.  Mitral Valve: The mitral valve is grossly normal. Mitral valve regurgitation is not visualized by color flow Doppler.  Tricuspid Valve: The tricuspid valve is grossly normal. Tricuspid valve regurgitation was not visualized by color flow Doppler.  Aortic Valve: The aortic valve is tricuspid Aortic valve regurgitation was not visualized by color flow Doppler. There is No stenosis of the aortic valve.  Pulmonic Valve: The pulmonic valve was grossly normal. Pulmonic valve regurgitation is not visualized by color flow Doppler.  Aorta: The aortic root and ascending aorta are normal in size and structure.   +--------------+--------++ LEFT VENTRICLE         +----------------+---------++ +--------------+--------++ Diastology                PLAX 2D                +----------------+---------++ +--------------+--------++ LV e' lateral:  9.57 cm/s LVIDd:        4.50 cm  +----------------+---------++ +--------------+--------++ LV E/e' lateral:8.7       LVIDs:        2.39 cm  +----------------+---------++ +--------------+--------++ LV e' medial:   6.64 cm/s LV PW:        1.12 cm  +----------------+---------++ +--------------+--------++ LV E/e' medial: 12.5      LV IVS:       0.85 cm  +----------------+---------++ +--------------+--------++ LVOT diam:    2.40 cm  +----------------------+-------++ +--------------+--------++ 2D Longitudinal Strain        LV SV:        72 ml    +----------------------+-------++ +--------------+--------++ 2D Strain GLS Avg:    -21.0 % LV SV Index:  29.63     +----------------------+-------++ +--------------+--------++ LVOT Area:    4.52 cm +--------------+--------++                        +--------------+--------++  +---------------+----------++ RIGHT VENTRICLE           +---------------+----------++ RV Basal diam: 3.21 cm    +---------------+----------++ RV S prime:    13.20 cm/s +---------------+----------++ TAPSE (M-mode):2.2 cm     +---------------+----------++  +---------------+-------++-----------++ LEFT ATRIUM           Index       +---------------+-------++-----------++ LA diam:       4.00 cm1.73 cm/m  +---------------+-------++-----------++ LA Vol (A2C):  44.9 ml19.44 ml/m +---------------+-------++-----------++ LA Vol (A4C):  47.3 ml20.48 ml/m +---------------+-------++-----------++ LA Biplane Vol:46.7 ml20.22 ml/m +---------------+-------++-----------++ +------------+-----------++ AORTIC VALVE            +------------+-----------++ LVOT Vmax:  106.00 cm/s +------------+-----------++  LVOT Vmean: 77.900 cm/s +------------+-----------++ LVOT VTI:   0.235 m     +------------+-----------++  +-------------+-------++ AORTA                +-------------+-------++ Ao Root diam:3.10 cm +-------------+-------++ Ao Asc diam: 3.40 cm +-------------+-------++  +--------------+--------++ MITRAL VALVE             +--------------+-------+ +--------------+--------++   SHUNTS                MV Area (PHT):           +--------------+-------+ +--------------+--------++   Systemic VTI: 0.24 m  MV PHT:                  +--------------+-------+ +--------------+--------++   Systemic Diam:2.40 cm MV Decel Time:292 msec   +--------------+-------+ +--------------+--------++ +--------------+----------++ MV E velocity:82.90 cm/s +--------------+----------++ MV A velocity:72.80  cm/s +--------------+----------++ MV E/A ratio: 1.14       +--------------+----------++   Lyman Bishop MD Electronically signed by Lyman Bishop MD Signature Date/Time: 06/30/2019/2:20:10 PM    Final     CT SCANS  CT CARDIAC SCORING (SELF PAY ONLY) 01/30/2023  Addendum 01/30/2023  3:34 PM ADDENDUM REPORT: 01/30/2023 15:32  EXAM: OVER-READ INTERPRETATION  CT CHEST  The following report is an over-read performed by radiologist Dr. Rebekah Chesterfield [facility] on 01/30/2023. This over-read does not include interpretation of cardiac or coronary anatomy or pathology. The coronary calcium score interpretation by the cardiologist is attached.  COMPARISON:  None.  FINDINGS: Atherosclerotic calcifications in the thoracic aorta. Within the visualized portions of the thorax there are no suspicious appearing pulmonary nodules or masses, there is no acute consolidative airspace disease, no pleural effusions, no pneumothorax and no lymphadenopathy. Visualized portions of the upper abdomen demonstrates severe diffuse low attenuation throughout the visualized hepatic parenchyma, indicative of a background of severe hepatic steatosis. There are no aggressive appearing lytic or blastic lesions noted in the visualized portions of the skeleton.  IMPRESSION: 1.  Aortic Atherosclerosis (ICD10-I70.0). 2. Severe hepatic steatosis.   Electronically Signed By: Vinnie Langton M.D. On: 01/30/2023 15:32  Narrative CLINICAL DATA:  31M for cardiovascular disease risk stratification  EXAM: Coronary Calcium Score  TECHNIQUE: A gated, non-contrast computed tomography scan of the heart was performed using 5mm slice thickness. Axial images were analyzed on a dedicated workstation. Calcium scoring of the coronary arteries was performed using the Agatston method.  FINDINGS: Coronary arteries: Normal origins.  Coronary Calcium Score:  Left main: 0  Left anterior  descending artery: 57.7  Left circumflex artery: 192  Right coronary artery: 0  Total: 250  Percentile: 95th  Pericardium: Normal.  Ascending Aorta: Normal caliber.  3.5 cm.  Aortic atherosclerosis.  Non-cardiac: See separate report from [facility].  IMPRESSION: Coronary calcium score of 250. This was 95th percentile for age-, race-, and sex-matched controls.  Aortic atherosclerosis.  RECOMMENDATIONS: Coronary artery calcium (CAC) score is a strong predictor of incident coronary heart disease (CHD) and provides predictive information beyond traditional risk factors. CAC scoring is reasonable to use in the decision to withhold, postpone, or initiate statin therapy in intermediate-risk or selected borderline-risk asymptomatic adults (age 57-75 years and LDL-C >=70 to <190 mg/dL) who do not have diabetes or established atherosclerotic cardiovascular disease (ASCVD).* In intermediate-risk (10-year ASCVD risk >=7.5% to <20%) adults or selected borderline-risk (10-year ASCVD risk >=5% to <7.5%) adults in whom a CAC score is measured for the purpose of making a treatment decision the following recommendations have been made:  If CAC=0, it is reasonable to withhold statin therapy and reassess in 5 to 10 years, as long as higher risk conditions are absent (diabetes mellitus, family history of premature CHD in first degree relatives (males <55 years; females <65 years), cigarette smoking, or LDL >=190 mg/dL).  If CAC is 1 to 99, it is reasonable to initiate statin therapy for patients >=66 years of age.  If CAC is >=100 or >=75th percentile, it is reasonable to initiate statin therapy at any age.  Cardiology referral should be considered for patients with CAC scores >=400 or >=75th percentile.  *2018 AHA/ACC/AACVPR/AAPA/ABC/ACPM/ADA/AGS/APhA/ASPC/NLA/PCNA Guideline on the Management of Blood Cholesterol: A Report of the American College of Cardiology/American  Heart Association Task Force on Clinical Practice Guidelines. J Am Coll Cardiol. 2019;73(24):3168-3209.  Skeet Latch, MD  Electronically Signed: By: Skeet Latch M.D. On: 01/30/2023 15:10          Risk Assessment/Calculations:     HYPERTENSION CONTROL Vitals:   03/08/23 0819 03/08/23 0849  BP: (!) 148/90 (!) 140/90    The patient's blood pressure is elevated above target today.  In order to address the patient's elevated BP: A current anti-hypertensive medication was adjusted today.           Physical Exam:   VS:  BP (!) 140/90   Pulse 81   Ht 5\' 9"  (1.753 m)   Wt 252 lb 6.4 oz (114.5 kg)   SpO2 97%   BMI 37.27 kg/m    Wt Readings from Last 3 Encounters:  03/08/23 252 lb 6.4 oz (114.5 kg)  06/30/19 260 lb (117.9 kg)  06/14/19 260 lb (117.9 kg)     GEN: Well nourished, well developed in no acute distress NECK: No JVD; No carotid bruits CARDIAC: RRR, no murmurs, rubs, gallops RESPIRATORY:  Clear to auscultation without rales, wheezing or rhonchi  ABDOMEN: Soft, non-tender, non-distended EXTREMITIES:  No edema; No deformity   ASSESSMENT AND PLAN:    #Coronary Artery Ca: Ca score elevated at 250 (95%). Currently with worsening DOE when compared to several months ago. Will check coronary CTA for further evaluation and continue with aggressive secondary prevention. -Check coronary CTA -Continue crestor 10mg  daily -Continue ASA 81mg  daily  #HTN: -Elevated to 140s in clinic -Continue losartan-HCTZ 100-25mg  daily -Increase amlodipine to 5mg  daily -Goal BP <130/90  #DMII: -Continue trulicity -Continue metformin -Continue farxiga  -Management per PCP  #HLD: #Fatty Liver Disease: -Continue crestor 10mg  daily -Check lipids in 3 weeks -Goal LDL<70  #OSA: -On CPAP        Signed, Freada Bergeron, MD

## 2023-03-08 ENCOUNTER — Encounter: Payer: Self-pay | Admitting: Cardiology

## 2023-03-08 ENCOUNTER — Ambulatory Visit: Payer: Commercial Managed Care - PPO | Attending: Cardiology | Admitting: Cardiology

## 2023-03-08 VITALS — BP 140/90 | HR 81 | Ht 69.0 in | Wt 252.4 lb

## 2023-03-08 DIAGNOSIS — G4733 Obstructive sleep apnea (adult) (pediatric): Secondary | ICD-10-CM

## 2023-03-08 DIAGNOSIS — E785 Hyperlipidemia, unspecified: Secondary | ICD-10-CM | POA: Diagnosis not present

## 2023-03-08 DIAGNOSIS — R072 Precordial pain: Secondary | ICD-10-CM | POA: Diagnosis not present

## 2023-03-08 DIAGNOSIS — Z79899 Other long term (current) drug therapy: Secondary | ICD-10-CM

## 2023-03-08 DIAGNOSIS — I10 Essential (primary) hypertension: Secondary | ICD-10-CM

## 2023-03-08 DIAGNOSIS — E119 Type 2 diabetes mellitus without complications: Secondary | ICD-10-CM

## 2023-03-08 DIAGNOSIS — I25119 Atherosclerotic heart disease of native coronary artery with unspecified angina pectoris: Secondary | ICD-10-CM

## 2023-03-08 DIAGNOSIS — K760 Fatty (change of) liver, not elsewhere classified: Secondary | ICD-10-CM

## 2023-03-08 DIAGNOSIS — Z8249 Family history of ischemic heart disease and other diseases of the circulatory system: Secondary | ICD-10-CM

## 2023-03-08 MED ORDER — METOPROLOL TARTRATE 100 MG PO TABS: 100.0000 mg | ORAL_TABLET | Freq: Once | ORAL | 0 refills | Status: DC

## 2023-03-08 MED ORDER — AMLODIPINE BESYLATE 5 MG PO TABS: 5.0000 mg | ORAL_TABLET | Freq: Every day | ORAL | 3 refills | Status: DC

## 2023-03-08 NOTE — Patient Instructions (Signed)
Medication Instructions:   INCREASE YOUR AMLODIPINE TO 5 MG BY MOUTH DAILY  *If you need a refill on your cardiac medications before your next appointment, please call your pharmacy*   Lab Work:  IN 3 WEEKS HERE IN THE OFFICE--BMET AND LIPIDS--PLEASE COME FASTING TO THIS LAB APPOINTMENT  If you have labs (blood work) drawn today and your tests are completely normal, you will receive your results only by: [place] (if you have MyChart) OR A paper copy in the mail If you have any lab test that is abnormal or we need to change your treatment, we will call you to review the results.    Testing/Procedures:    Your cardiac CT will be scheduled at one of the below locations:   [facility] [address] [place], [place] [address] [phone]  If scheduled at [facility], please arrive at the [facility] Entrance (Entrance C2) of [facility] 30 minutes prior to test start time. You can use the FREE valet parking offered at entrance C (encouraged to control the heart rate for the test)  Proceed to the [facility] Radiology Department (first floor) to check-in and test prep.  All radiology patients and guests should use entrance C2 at [facility], accessed from [facility], even though the hospital's physical address listed is [address]      Please follow these instructions carefully (unless otherwise directed):  Hold all erectile dysfunction medications at least 3 days (72 hrs) prior to test. (Ie viagra, cialis, sildenafil, tadalafil, etc) We will administer nitroglycerin during this exam.   On the Night Before the Test: Be sure to Drink plenty of water. Do not consume any caffeinated/decaffeinated beverages or chocolate 12 hours prior to your test. Do not take any antihistamines 12 hours prior to your test.   On the Day of the Test: Drink plenty of water until 1 hour prior to the test. Do not  eat any food 1 hour prior to test. You may take your regular medications prior to the test.  Take metoprolol 100 MG BY MOUTH (Lopressor) two hours prior to test. If you take LOSARTAN-Hydrochlorothiazide please HOLD on the morning of the test.        After the Test: Drink plenty of water. After receiving IV contrast, you may experience a mild flushed feeling. This is normal. On occasion, you may experience a mild rash up to 24 hours after the test. This is not dangerous. If this occurs, you can take Benadryl 25 mg and increase your fluid intake. If you experience trouble breathing, this can be serious. If it is severe call 911 IMMEDIATELY. If it is mild, please call our office. If you take any of these medications: Glipizide/Metformin, Avandament, Glucavance, please do not take 48 hours after completing test unless otherwise instructed.  We will call to schedule your test 2-4 weeks out understanding that some insurance companies will need an authorization prior to the service being performed.   For non-scheduling related questions, please contact the cardiac imaging nurse navigator should you have any questions/concerns: Marchia Bond, Cardiac Imaging Nurse Navigator Gordy Clement, Cardiac Imaging Nurse Navigator [facility] Direct Office Dial: [phone]   For scheduling needs, including cancellations and rescheduling, please call [place], [phone].    Follow-Up: At [facility], you and your health needs are our priority.  As part of our continuing mission to provide you with exceptional heart care, we  have created designated Provider Care Teams.  These Care Teams include your primary Cardiologist (physician) and Advanced Practice Providers (APPs -  Physician Assistants and Nurse Practitioners) who all work together to provide you with the care you need, when you need it.  We recommend signing up for the patient portal called "MyChart".  Sign  up information is provided on this After Visit Summary.  MyChart is used to connect with patients for Virtual Visits (Telemedicine).  Patients are able to view lab/test results, encounter notes, upcoming appointments, etc.  Non-urgent messages can be sent to your provider as well.   To learn more about what you can do with MyChart, go to [email or web address].    Your next appointment:   1 year(s)  Provider:   Nicholes Rough, PA-C, Melina Copa, PA-C, Ambrose Pancoast, NP, Cecilie Kicks, NP, Ermalinda Barrios, PA-C, Christen Bame, NP, or Richardson Dopp, PA-C

## 2023-03-31 ENCOUNTER — Ambulatory Visit: Payer: Commercial Managed Care - PPO | Attending: Cardiology

## 2023-03-31 ENCOUNTER — Telehealth (HOSPITAL_COMMUNITY): Payer: Self-pay | Admitting: *Deleted

## 2023-03-31 DIAGNOSIS — R072 Precordial pain: Secondary | ICD-10-CM

## 2023-03-31 DIAGNOSIS — I25119 Atherosclerotic heart disease of native coronary artery with unspecified angina pectoris: Secondary | ICD-10-CM

## 2023-03-31 DIAGNOSIS — E785 Hyperlipidemia, unspecified: Secondary | ICD-10-CM

## 2023-03-31 DIAGNOSIS — Z79899 Other long term (current) drug therapy: Secondary | ICD-10-CM

## 2023-03-31 NOTE — Telephone Encounter (Signed)
Reaching out to patient to offer assistance regarding upcoming cardiac imaging study; pt verbalizes understanding of appt date/time, parking situation and where to check in, pre-test NPO status and medications ordered, and verified current allergies; name and call back number provided for further questions should they arise  Braleigh Massoud RN Navigator Cardiac Imaging  [facility] [phone] office [phone] cell  Patient to take 100mg metoprolol tartrate two hours prior to his cardiac CT scan. 

## 2023-03-31 NOTE — Telephone Encounter (Signed)
Attempted to call patient regarding upcoming cardiac CT appointment. °Left message on voicemail with name and callback number ° °Collin Hendley RN Navigator Cardiac Imaging °[facility] °[phone] Office °[phone] Cell ° °

## 2023-04-01 ENCOUNTER — Telehealth: Payer: Self-pay | Admitting: *Deleted

## 2023-04-01 DIAGNOSIS — Z79899 Other long term (current) drug therapy: Secondary | ICD-10-CM

## 2023-04-01 DIAGNOSIS — Z8249 Family history of ischemic heart disease and other diseases of the circulatory system: Secondary | ICD-10-CM

## 2023-04-01 DIAGNOSIS — E785 Hyperlipidemia, unspecified: Secondary | ICD-10-CM

## 2023-04-01 DIAGNOSIS — E119 Type 2 diabetes mellitus without complications: Secondary | ICD-10-CM

## 2023-04-01 DIAGNOSIS — I25119 Atherosclerotic heart disease of native coronary artery with unspecified angina pectoris: Secondary | ICD-10-CM

## 2023-04-01 LAB — BASIC METABOLIC PANEL
BUN/Creatinine Ratio: 26 — ABNORMAL HIGH (ref 9–20)
BUN: 23 mg/dL (ref 6–24)
CO2: 22 mmol/L (ref 20–29)
Calcium: 9.3 mg/dL (ref 8.7–10.2)
Chloride: 103 mmol/L (ref 96–106)
Creatinine, Ser: 0.9 mg/dL (ref 0.76–1.27)
Glucose: 130 mg/dL — ABNORMAL HIGH (ref 70–99)
Potassium: 3.8 mmol/L (ref 3.5–5.2)
Sodium: 141 mmol/L (ref 134–144)
eGFR: 103 mL/min/{1.73_m2} (ref >59)

## 2023-04-01 LAB — LIPID PANEL
Chol/HDL Ratio: 4 ratio (ref 0.0–5.0)
Cholesterol, Total: 125 mg/dL (ref 100–199)
HDL: 31 mg/dL — ABNORMAL LOW (ref >39)
LDL Chol Calc (NIH): 60 mg/dL (ref 0–99)
Triglycerides: 204 mg/dL — ABNORMAL HIGH (ref 0–149)
VLDL Cholesterol Cal: 34 mg/dL (ref 5–40)

## 2023-04-01 NOTE — Telephone Encounter (Signed)
The patient has been notified of the result and verbalized understanding.  All questions (if any) were answered.  Pt aware that Dr. Shari Prows wants to repeat his lipids in 3 months, and wants him to continue with diet and exercise.  Scheduled the pt for repeat lipids 3 months for 07/02/23.  He is aware to come fasting to this lab appt.   Pt verbalized understanding and agrees with this plan.

## 2023-04-01 NOTE — Telephone Encounter (Signed)
-----   Message from Meriam Sprague, MD sent at 04/01/2023 12:44 PM EDT ----- Kidney function and electrolytes look good. Cholesterol is well controlled. TG slightly elevated but can continue to work on diet and exercise and will repeat lipids at follow-up visit

## 2023-04-02 ENCOUNTER — Encounter (HOSPITAL_COMMUNITY): Payer: Self-pay

## 2023-04-02 ENCOUNTER — Ambulatory Visit (HOSPITAL_COMMUNITY)
Admission: RE | Admit: 2023-04-02 | Discharge: 2023-04-02 | Disposition: A | Discharge status: Home / Self Care | Payer: Commercial Managed Care - PPO | Source: Ambulatory Visit | Attending: Cardiology | Admitting: Cardiology

## 2023-04-02 ENCOUNTER — Other Ambulatory Visit (HOSPITAL_COMMUNITY): Payer: Self-pay | Admitting: *Deleted

## 2023-04-02 DIAGNOSIS — E785 Hyperlipidemia, unspecified: Secondary | ICD-10-CM

## 2023-04-02 DIAGNOSIS — I25119 Atherosclerotic heart disease of native coronary artery with unspecified angina pectoris: Secondary | ICD-10-CM

## 2023-04-02 DIAGNOSIS — R072 Precordial pain: Secondary | ICD-10-CM

## 2023-04-02 DIAGNOSIS — Z79899 Other long term (current) drug therapy: Secondary | ICD-10-CM

## 2023-04-02 MED ORDER — DILTIAZEM HCL 25 MG/5ML IV SOLN
INTRAVENOUS | Status: AC
  Filled 2023-04-02: qty 5

## 2023-04-02 MED ORDER — METOPROLOL TARTRATE 5 MG/5ML IV SOLN
INTRAVENOUS | Status: AC
  Filled 2023-04-02: qty 20

## 2023-04-02 MED ORDER — METOPROLOL TARTRATE 100 MG PO TABS: 100.0000 mg | ORAL_TABLET | Freq: Once | ORAL | 0 refills | Status: DC

## 2023-04-02 MED ORDER — NITROGLYCERIN 0.4 MG SL SUBL: 0.8000 mg | SUBLINGUAL_TABLET | Freq: Once | SUBLINGUAL | Status: DC

## 2023-04-02 MED ORDER — DILTIAZEM HCL 25 MG/5ML IV SOLN
10.0000 mg | Freq: Once | INTRAVENOUS | Status: AC
  Administered 2023-04-02: 10 mg via INTRAVENOUS

## 2023-04-02 MED ORDER — IVABRADINE HCL 5 MG PO TABS: ORAL_TABLET | ORAL | 0 refills | Status: DC

## 2023-04-02 MED ORDER — METOPROLOL TARTRATE 5 MG/5ML IV SOLN
10.0000 mg | Freq: Once | INTRAVENOUS | Status: AC
  Administered 2023-04-02: 10 mg via INTRAVENOUS

## 2023-04-09 ENCOUNTER — Ambulatory Visit (HOSPITAL_COMMUNITY): Payer: Commercial Managed Care - PPO

## 2023-04-29 ENCOUNTER — Telehealth: Payer: Self-pay | Admitting: Cardiology

## 2023-04-29 ENCOUNTER — Other Ambulatory Visit (HOSPITAL_COMMUNITY): Payer: Self-pay | Admitting: *Deleted

## 2023-04-29 DIAGNOSIS — Z79899 Other long term (current) drug therapy: Secondary | ICD-10-CM

## 2023-04-29 DIAGNOSIS — R072 Precordial pain: Secondary | ICD-10-CM

## 2023-04-29 DIAGNOSIS — E785 Hyperlipidemia, unspecified: Secondary | ICD-10-CM

## 2023-04-29 DIAGNOSIS — I25119 Atherosclerotic heart disease of native coronary artery with unspecified angina pectoris: Secondary | ICD-10-CM

## 2023-04-29 MED ORDER — IVABRADINE HCL 5 MG PO TABS: ORAL_TABLET | ORAL | 0 refills | Status: AC

## 2023-04-29 MED ORDER — METOPROLOL TARTRATE 100 MG PO TABS: 100.0000 mg | ORAL_TABLET | Freq: Once | ORAL | 0 refills | Status: AC

## 2023-04-29 NOTE — Telephone Encounter (Signed)
Please disregard

## 2023-04-29 NOTE — Telephone Encounter (Signed)
Pt called back per message received in [facility] Triage.   Pt states that first time he attempted the Coronary CTA, HR was too high.  [facility] rescheduled this test for him.  The second attempt have this test done, Pt insurance company declined CTA, b/c auth for prior date;  Pt forced to reschedule CTA.  Pt stated he has already paid for portion of test.    Pt advised Dr. Shari Prows and Lajoyce Corners RN aware, and Ms. Rivka Safer also aware of insurance auth concern.   Per Ms. [place]-  "Hey Let me find out about his auth then I will call to get him r/s. I seen the last note say the appt was cx due to auth not back in time "  Pt appreciated Dr. Shara Blazing RN, and Ms. Brittany's time fixing this auth concern, so he may proceed with Coronary CTA scheduling.    Message will be forwarded to [facility] RN for follow up.

## 2023-04-29 NOTE — Telephone Encounter (Signed)
Pt is requesting a callback regarding his CT CARDIAC MORPH that he hasn't been contacted about. Please advise

## 2023-04-29 NOTE — Telephone Encounter (Signed)
Pts Cardiac CT is scheduled for 05/11/23 at 0900. Pt made aware of appt date and time by CT Scheduler.

## 2023-05-07 ENCOUNTER — Telehealth (HOSPITAL_COMMUNITY): Payer: Self-pay | Admitting: *Deleted

## 2023-05-07 NOTE — Telephone Encounter (Signed)
Reaching out to patient to offer assistance regarding upcoming cardiac imaging study; pt verbalizes understanding of appt date/time, parking situation and where to check in, pre-test NPO status and medications ordered, and verified current allergies; name and call back number provided for further questions should they arise  Larey Brick RN Navigator Cardiac Imaging [facility] [phone] office [phone] cell  Patient to take 100mg  metoprolol tartrate and 10mg  ivabradine two hours prior to his cardiac CT scan. He is aware to arrive at 8:30am.

## 2023-05-11 ENCOUNTER — Ambulatory Visit (HOSPITAL_COMMUNITY)
Admission: RE | Admit: 2023-05-11 | Discharge: 2023-05-11 | Disposition: A | Discharge status: Home / Self Care | Payer: Commercial Managed Care - PPO | Source: Ambulatory Visit | Attending: Cardiology | Admitting: Cardiology

## 2023-05-11 DIAGNOSIS — R072 Precordial pain: Secondary | ICD-10-CM | POA: Diagnosis present

## 2023-05-11 DIAGNOSIS — I25119 Atherosclerotic heart disease of native coronary artery with unspecified angina pectoris: Secondary | ICD-10-CM | POA: Insufficient documentation

## 2023-05-11 DIAGNOSIS — Z79899 Other long term (current) drug therapy: Secondary | ICD-10-CM | POA: Insufficient documentation

## 2023-05-11 DIAGNOSIS — E785 Hyperlipidemia, unspecified: Secondary | ICD-10-CM | POA: Insufficient documentation

## 2023-05-11 MED ORDER — IOHEXOL 350 MG/ML SOLN
95.0000 mL | Freq: Once | INTRAVENOUS | Status: AC | PRN
  Administered 2023-05-11: 95 mL via INTRAVENOUS

## 2023-05-11 MED ORDER — METOPROLOL TARTRATE 5 MG/5ML IV SOLN
5.0000 mg | Freq: Once | INTRAVENOUS | Status: AC
  Administered 2023-05-11: 5 mg via INTRAVENOUS

## 2023-05-11 MED ORDER — METOPROLOL TARTRATE 5 MG/5ML IV SOLN: 5.0000 mg | Freq: Once | INTRAVENOUS | Status: DC

## 2023-05-11 MED ORDER — METOPROLOL TARTRATE 5 MG/5ML IV SOLN
INTRAVENOUS | Status: AC
  Filled 2023-05-11: qty 5

## 2023-05-11 MED ORDER — NITROGLYCERIN 0.4 MG SL SUBL
SUBLINGUAL_TABLET | SUBLINGUAL | Status: AC
  Filled 2023-05-11: qty 2

## 2023-05-11 MED ORDER — METOPROLOL TARTRATE 5 MG/5ML IV SOLN: 10.0000 mg | Freq: Once | INTRAVENOUS | Status: DC

## 2023-05-11 MED ORDER — NITROGLYCERIN 0.4 MG SL SUBL
0.8000 mg | SUBLINGUAL_TABLET | Freq: Once | SUBLINGUAL | Status: AC
  Administered 2023-05-11: 0.8 mg via SUBLINGUAL

## 2023-05-11 NOTE — Progress Notes (Signed)
CT scan completed. Tolerated well. D/C home ambulatory. Awake and alert. In no distress 

## 2023-05-26 ENCOUNTER — Other Ambulatory Visit (HOSPITAL_BASED_OUTPATIENT_CLINIC_OR_DEPARTMENT_OTHER): Payer: Self-pay

## 2023-05-26 MED ORDER — TRULICITY 1.5 MG/0.5ML ~~LOC~~ SOAJ
1.5000 mg | SUBCUTANEOUS | 4 refills | Status: AC
  Filled 2023-05-26 – 2023-06-10 (×2): qty 2, 28d supply, fill #0
  Filled 2023-07-16: qty 2, 28d supply, fill #1
  Filled 2023-08-16: qty 2, 28d supply, fill #2
  Filled 2023-09-16: qty 2, 28d supply, fill #3
  Filled 2023-10-14: qty 2, 28d supply, fill #4
  Filled 2023-11-09: qty 2, 28d supply, fill #5
  Filled 2023-12-05: qty 2, 28d supply, fill #6
  Filled 2024-01-07 – 2024-01-08 (×2): qty 2, 28d supply, fill #7
  Filled 2024-02-07: qty 2, 28d supply, fill #8
  Filled 2024-03-01: qty 2, 28d supply, fill #9
  Filled 2024-03-31: qty 2, 28d supply, fill #10

## 2023-06-07 ENCOUNTER — Other Ambulatory Visit (HOSPITAL_BASED_OUTPATIENT_CLINIC_OR_DEPARTMENT_OTHER): Payer: Self-pay

## 2023-06-10 ENCOUNTER — Other Ambulatory Visit (HOSPITAL_BASED_OUTPATIENT_CLINIC_OR_DEPARTMENT_OTHER): Payer: Self-pay

## 2023-07-02 ENCOUNTER — Ambulatory Visit: Payer: Commercial Managed Care - PPO | Attending: Cardiology

## 2023-07-02 DIAGNOSIS — I25119 Atherosclerotic heart disease of native coronary artery with unspecified angina pectoris: Secondary | ICD-10-CM

## 2023-07-02 DIAGNOSIS — E785 Hyperlipidemia, unspecified: Secondary | ICD-10-CM

## 2023-07-02 DIAGNOSIS — E119 Type 2 diabetes mellitus without complications: Secondary | ICD-10-CM

## 2023-07-02 DIAGNOSIS — I10 Essential (primary) hypertension: Secondary | ICD-10-CM

## 2023-07-02 DIAGNOSIS — Z8249 Family history of ischemic heart disease and other diseases of the circulatory system: Secondary | ICD-10-CM

## 2023-07-02 DIAGNOSIS — Z79899 Other long term (current) drug therapy: Secondary | ICD-10-CM

## 2023-07-03 LAB — LIPID PANEL
Chol/HDL Ratio: 3.8 ratio (ref 0.0–5.0)
Cholesterol, Total: 126 mg/dL (ref 100–199)
HDL: 33 mg/dL — ABNORMAL LOW (ref >39)
LDL Chol Calc (NIH): 66 mg/dL (ref 0–99)
Triglycerides: 157 mg/dL — ABNORMAL HIGH (ref 0–149)
VLDL Cholesterol Cal: 27 mg/dL (ref 5–40)

## 2023-07-16 ENCOUNTER — Other Ambulatory Visit (HOSPITAL_BASED_OUTPATIENT_CLINIC_OR_DEPARTMENT_OTHER): Payer: Self-pay

## 2023-12-07 ENCOUNTER — Other Ambulatory Visit (HOSPITAL_BASED_OUTPATIENT_CLINIC_OR_DEPARTMENT_OTHER): Payer: Self-pay

## 2023-12-07 MED ORDER — FLULAVAL 0.5 ML IM SUSY
0.5000 mL | PREFILLED_SYRINGE | Freq: Once | INTRAMUSCULAR | 0 refills | Status: AC
  Filled 2023-12-07: qty 0.5, 1d supply, fill #0

## 2024-01-08 ENCOUNTER — Other Ambulatory Visit (HOSPITAL_BASED_OUTPATIENT_CLINIC_OR_DEPARTMENT_OTHER): Payer: Self-pay

## 2024-01-11 ENCOUNTER — Other Ambulatory Visit (HOSPITAL_BASED_OUTPATIENT_CLINIC_OR_DEPARTMENT_OTHER): Payer: Self-pay

## 2024-03-02 ENCOUNTER — Other Ambulatory Visit (HOSPITAL_BASED_OUTPATIENT_CLINIC_OR_DEPARTMENT_OTHER): Payer: Self-pay

## 2024-06-08 ENCOUNTER — Other Ambulatory Visit: Payer: Self-pay

## 2024-06-08 DIAGNOSIS — E785 Hyperlipidemia, unspecified: Secondary | ICD-10-CM

## 2024-06-08 DIAGNOSIS — R072 Precordial pain: Secondary | ICD-10-CM

## 2024-06-08 DIAGNOSIS — I25119 Atherosclerotic heart disease of native coronary artery with unspecified angina pectoris: Secondary | ICD-10-CM

## 2024-06-08 DIAGNOSIS — Z79899 Other long term (current) drug therapy: Secondary | ICD-10-CM

## 2024-06-08 MED ORDER — AMLODIPINE BESYLATE 5 MG PO TABS: 5.0000 mg | ORAL_TABLET | Freq: Every day | ORAL | 0 refills | Status: DC

## 2024-07-01 ENCOUNTER — Other Ambulatory Visit: Payer: Self-pay | Admitting: Physician Assistant

## 2024-07-01 DIAGNOSIS — Z79899 Other long term (current) drug therapy: Secondary | ICD-10-CM

## 2024-07-01 DIAGNOSIS — R072 Precordial pain: Secondary | ICD-10-CM

## 2024-07-01 DIAGNOSIS — E785 Hyperlipidemia, unspecified: Secondary | ICD-10-CM

## 2024-07-01 DIAGNOSIS — I25119 Atherosclerotic heart disease of native coronary artery with unspecified angina pectoris: Secondary | ICD-10-CM

## 2024-08-02 ENCOUNTER — Other Ambulatory Visit: Payer: Self-pay | Admitting: Physician Assistant

## 2024-08-02 DIAGNOSIS — R072 Precordial pain: Secondary | ICD-10-CM

## 2024-08-02 DIAGNOSIS — E785 Hyperlipidemia, unspecified: Secondary | ICD-10-CM

## 2024-08-02 DIAGNOSIS — I25119 Atherosclerotic heart disease of native coronary artery with unspecified angina pectoris: Secondary | ICD-10-CM

## 2024-08-02 DIAGNOSIS — Z79899 Other long term (current) drug therapy: Secondary | ICD-10-CM

## 2024-08-12 ENCOUNTER — Other Ambulatory Visit: Payer: Self-pay | Admitting: Physician Assistant

## 2024-08-12 DIAGNOSIS — E785 Hyperlipidemia, unspecified: Secondary | ICD-10-CM

## 2024-08-12 DIAGNOSIS — Z79899 Other long term (current) drug therapy: Secondary | ICD-10-CM

## 2024-08-12 DIAGNOSIS — I25119 Atherosclerotic heart disease of native coronary artery with unspecified angina pectoris: Secondary | ICD-10-CM

## 2024-08-12 DIAGNOSIS — R072 Precordial pain: Secondary | ICD-10-CM

## 2024-08-22 ENCOUNTER — Other Ambulatory Visit: Payer: Self-pay | Admitting: Physician Assistant

## 2024-08-22 DIAGNOSIS — Z79899 Other long term (current) drug therapy: Secondary | ICD-10-CM

## 2024-08-22 DIAGNOSIS — I25119 Atherosclerotic heart disease of native coronary artery with unspecified angina pectoris: Secondary | ICD-10-CM

## 2024-08-22 DIAGNOSIS — R072 Precordial pain: Secondary | ICD-10-CM

## 2024-08-22 DIAGNOSIS — E785 Hyperlipidemia, unspecified: Secondary | ICD-10-CM

## 2024-08-31 ENCOUNTER — Other Ambulatory Visit: Payer: Self-pay | Admitting: Physician Assistant

## 2024-08-31 DIAGNOSIS — E785 Hyperlipidemia, unspecified: Secondary | ICD-10-CM

## 2024-08-31 DIAGNOSIS — R072 Precordial pain: Secondary | ICD-10-CM

## 2024-08-31 DIAGNOSIS — Z79899 Other long term (current) drug therapy: Secondary | ICD-10-CM

## 2024-08-31 DIAGNOSIS — I25119 Atherosclerotic heart disease of native coronary artery with unspecified angina pectoris: Secondary | ICD-10-CM
